# Patient Record
Sex: Female | Born: 1950 | Race: Asian | Hispanic: No | Marital: Married | Smoking: Never smoker
Health system: Southern US, Community
[De-identification: ages and names within clinical notes are randomized; demographics above are authoritative.]

## PROBLEM LIST (undated history)

## (undated) DIAGNOSIS — E785 Hyperlipidemia, unspecified: Secondary | ICD-10-CM

## (undated) DIAGNOSIS — K2270 Barrett's esophagus without dysplasia: Secondary | ICD-10-CM

## (undated) DIAGNOSIS — K219 Gastro-esophageal reflux disease without esophagitis: Secondary | ICD-10-CM

## (undated) HISTORY — PX: ABDOMINAL HYSTERECTOMY: SHX81

---

## 1999-02-23 DIAGNOSIS — C50919 Malignant neoplasm of unspecified site of unspecified female breast: Secondary | ICD-10-CM

## 1999-02-23 HISTORY — DX: Malignant neoplasm of unspecified site of unspecified female breast: C50.919

## 1999-02-23 HISTORY — PX: AUGMENTATION MAMMAPLASTY: SUR837

## 1999-02-23 HISTORY — PX: MASTECTOMY: SHX3

## 2018-10-23 ENCOUNTER — Other Ambulatory Visit: Payer: Self-pay | Admitting: Family Medicine

## 2018-10-23 DIAGNOSIS — Z1231 Encounter for screening mammogram for malignant neoplasm of breast: Secondary | ICD-10-CM

## 2018-11-08 ENCOUNTER — Other Ambulatory Visit: Payer: Self-pay | Admitting: Family Medicine

## 2018-11-08 DIAGNOSIS — M810 Age-related osteoporosis without current pathological fracture: Secondary | ICD-10-CM

## 2019-02-19 ENCOUNTER — Other Ambulatory Visit: Payer: Self-pay

## 2019-02-19 ENCOUNTER — Ambulatory Visit: Payer: Self-pay

## 2019-04-04 ENCOUNTER — Other Ambulatory Visit: Payer: Self-pay

## 2019-04-04 ENCOUNTER — Inpatient Hospital Stay: Admission: RE | Admit: 2019-04-04 | Payer: Self-pay | Source: Ambulatory Visit

## 2019-06-04 ENCOUNTER — Ambulatory Visit: Payer: Self-pay

## 2019-06-04 ENCOUNTER — Inpatient Hospital Stay: Admission: RE | Admit: 2019-06-04 | Payer: Self-pay | Source: Ambulatory Visit

## 2019-06-05 ENCOUNTER — Ambulatory Visit
Admission: RE | Admit: 2019-06-05 | Discharge: 2019-06-05 | Disposition: A | Discharge status: Home / Self Care | Payer: Medicare Other | Source: Ambulatory Visit | Attending: Family Medicine | Admitting: Family Medicine

## 2019-06-05 ENCOUNTER — Other Ambulatory Visit: Payer: Self-pay | Admitting: Family Medicine

## 2019-06-05 ENCOUNTER — Other Ambulatory Visit: Payer: Self-pay

## 2019-06-05 ENCOUNTER — Encounter (INDEPENDENT_AMBULATORY_CARE_PROVIDER_SITE_OTHER): Payer: Self-pay

## 2019-06-05 DIAGNOSIS — Z1231 Encounter for screening mammogram for malignant neoplasm of breast: Secondary | ICD-10-CM

## 2019-06-05 DIAGNOSIS — M810 Age-related osteoporosis without current pathological fracture: Secondary | ICD-10-CM | POA: Diagnosis not present

## 2020-08-25 IMAGING — MG DIGITAL SCREENING UNILAT RIGHT IMPLANT  W/ TOMO W/ CAD
8 series · 8 of 16 positions shown · non-contrast
Comparison: Previous exam(s).

CLINICAL DATA: Screening.

EXAM:
DIGITAL SCREENING UNILATERAL RIGHT MAMMOGRAM WITH IMPLANTS, CAD AND
TOMO
The patient has a retropectoral implant. Standard and implant
displaced views were performed.

[R CC (1 of 2)]
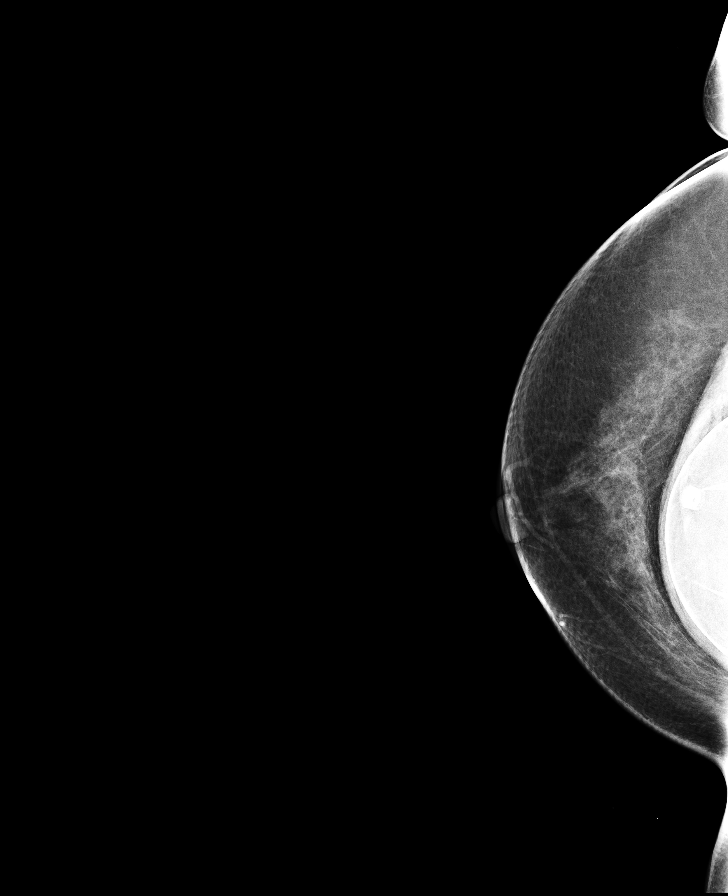

[R MLO (1 of 2)]
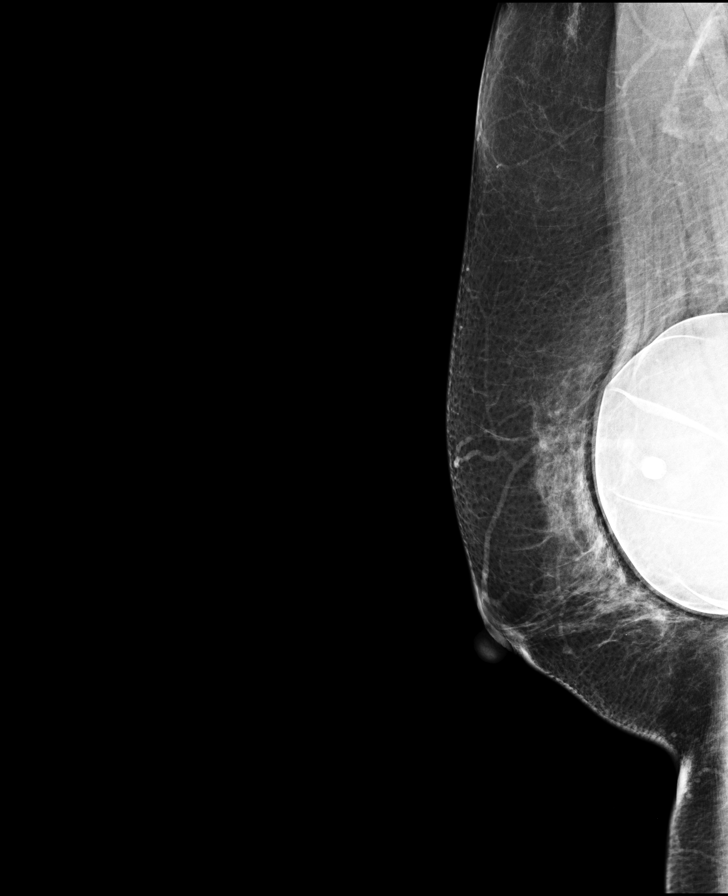

[R CC synth-2D]
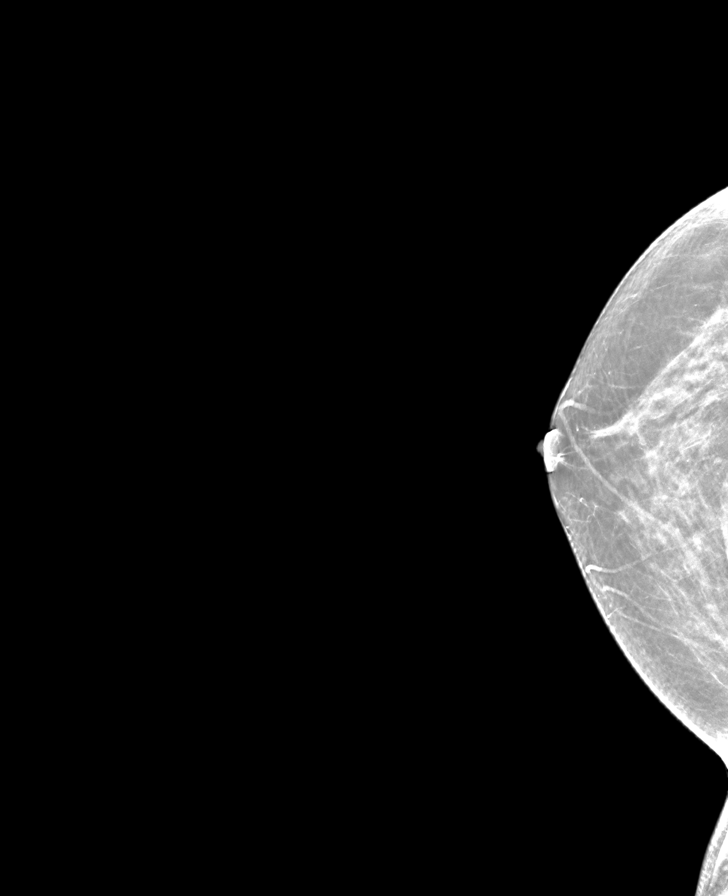

[R MLO (2 of 2)]
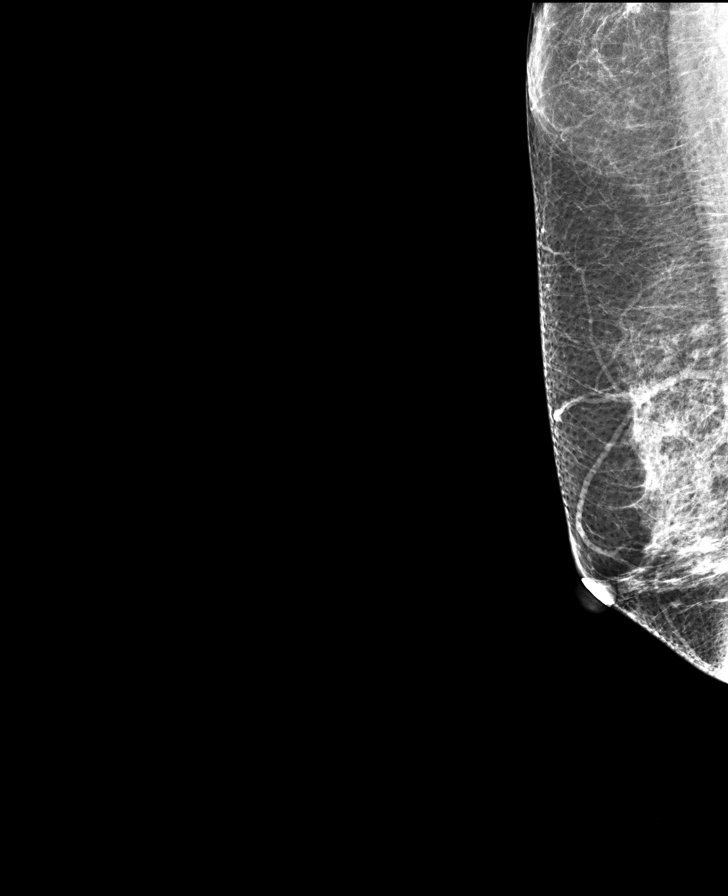

[R CC (2 of 2)]
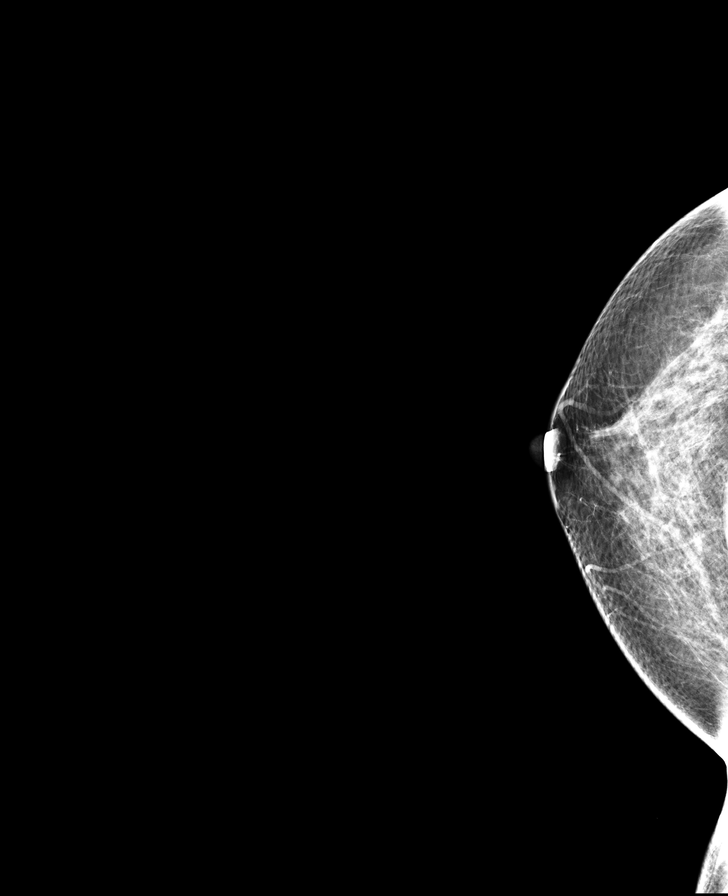

[R MLO synth-2D]
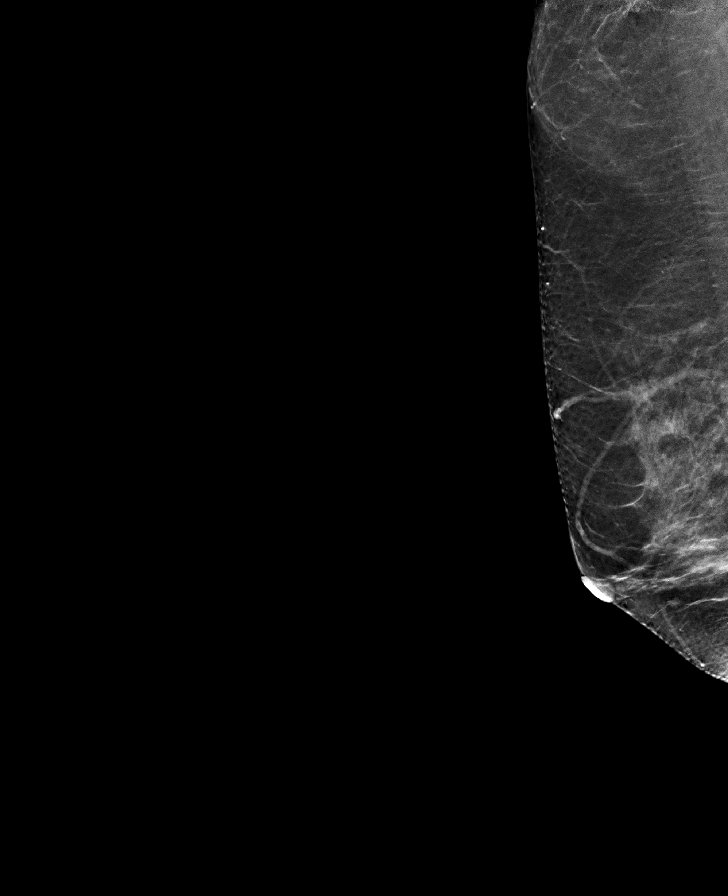

[R MLOID BREAST TOMOSYNTHESIS IMAGE tomo · tomo slice 33/64.0]
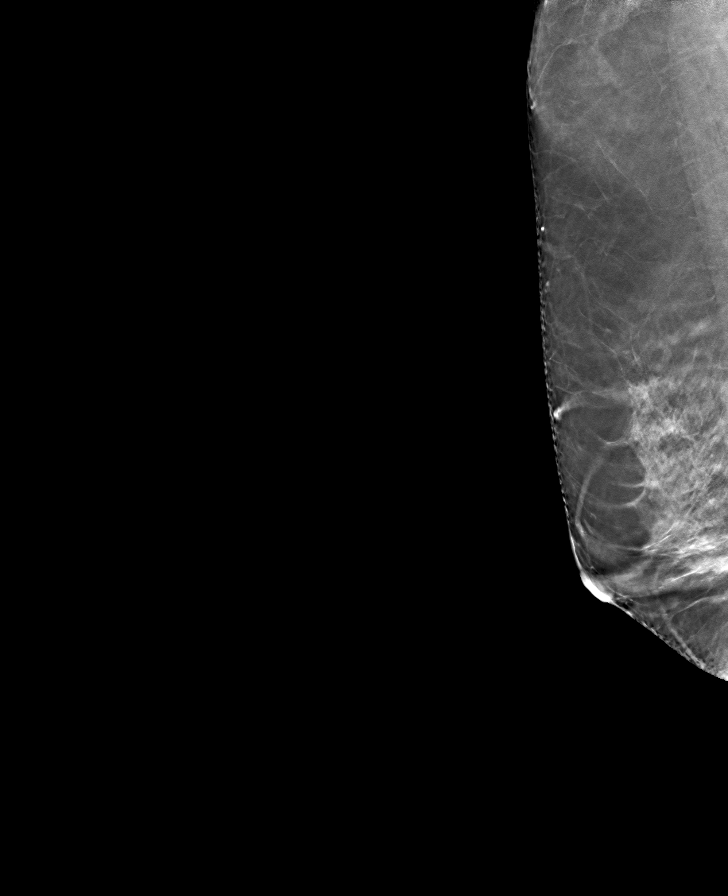

[R CCID BREAST TOMOSYNTHESIS IMAGE tomo · tomo slice 31/60.0]
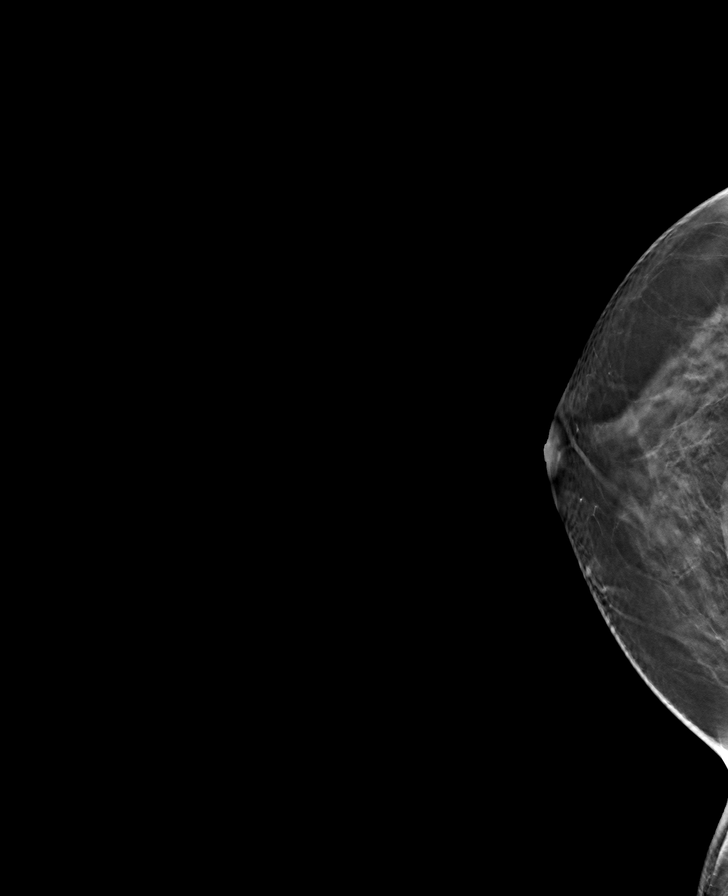

[8 of 16 positions shown; findings below may reference images not displayed]

ACR Breast Density Category b: There are scattered areas of
fibroglandular density.
FINDINGS: The patient has had a left mastectomy. There are no findings
suspicious for malignancy.

Images were processed with CAD.
IMPRESSION: No mammographic evidence of malignancy. A result letter of this
screening mammogram will be mailed directly to the patient.

RECOMMENDATION:
Screening mammogram in one year.  (Code:6P-P-C2P)

BI-RADS CATEGORY  1.  Negative

## 2021-11-17 ENCOUNTER — Encounter: Payer: Self-pay | Admitting: Emergency Medicine

## 2021-11-17 ENCOUNTER — Ambulatory Visit
Admission: EM | Admit: 2021-11-17 | Discharge: 2021-11-17 | Disposition: A | Discharge status: Home / Self Care | Payer: Medicare Other

## 2021-11-17 DIAGNOSIS — H1032 Unspecified acute conjunctivitis, left eye: Secondary | ICD-10-CM | POA: Diagnosis not present

## 2021-11-17 MED ORDER — MOXIFLOXACIN HCL 0.5 % OP SOLN: 1.0000 [drp] | Freq: Three times a day (TID) | OPHTHALMIC | 0 refills | Status: AC

## 2021-11-17 NOTE — ED Provider Notes (Signed)
[facility]    CSN: [id number] Arrival date & time: 11/17/21  1427      History   Chief Complaint Chief Complaint  Patient presents with   Eye Irritation     HPI Diana Whitney is a 71 y.o. female presenting for left eye redness, discomfort/irritation and thick yellowish-green drainage.  Patient says she woke up with the symptoms today.  She says that she had some sort of insect fly into her eye yesterday and had to flush it out.  She says she thinks it look like mosquito or not.  Denies vision change, headaches, dizziness.  Has not treated condition yet.  Does not wear contacts or glasses.  HPI  Past Medical History:  Diagnosis Date   Breast cancer ([place]) 2001   lt mastectomy    There are no problems to display for this patient.   Past Surgical History:  Procedure Laterality Date   AUGMENTATION MAMMAPLASTY Bilateral 2001   MASTECTOMY Left 2001    OB History   No obstetric history on file.      Home Medications    Prior to Admission medications   Medication Sig Start Date End Date Taking? Authorizing Provider  moxifloxacin (VIGAMOX) 0.5 % ophthalmic solution Place 1 drop into the left eye 3 (three) times daily for 7 days. 11/17/21 11/24/21 Yes Laurene Footman B, PA-C  pravastatin (PRAVACHOL) 80 MG tablet Take 1 tablet by mouth at bedtime. 09/10/21 09/10/22 Yes [provider]  pantoprazole (PROTONIX) 20 MG tablet Take 20 mg by mouth daily. 09/01/21   [provider]    Family History Family History  Problem Relation Age of Onset   Breast cancer Sister 67    Social History Social History   Tobacco Use   Smoking status: Never   Smokeless tobacco: Never  Vaping Use   Vaping Use: Never used  Substance Use Topics   Alcohol use: Not Currently   Drug use: Never     Allergies   Patient has no known allergies.   Review of Systems Review of Systems  HENT:  Negative for facial swelling.   Eyes:  Positive for discharge and redness.  Negative for photophobia, pain, itching and visual disturbance.  Skin:  Negative for wound.  Neurological:  Negative for dizziness and headaches.     Physical Exam Triage Vital Signs ED Triage Vitals [11/17/21 1501]  Enc Vitals Group     BP      Pulse      Resp      Temp      Temp src      SpO2      Weight      Height      Head Circumference      Peak Flow      Pain Score 4     Pain Loc      Pain Edu?      Excl. in [place]?    No data found.  Updated Vital Signs BP (!) 153/78 (BP Location: Left Arm)   Pulse 77   Temp 98.7 F (37.1 C) (Oral)   Resp 16   SpO2 100%   Visual Acuity Right Eye Distance:   Left Eye Distance:   Bilateral Distance:    Physical Exam Vitals and nursing note reviewed.  Constitutional:      General: She is not in acute distress.    Appearance: Normal appearance. She is not ill-appearing or toxic-appearing.  HENT:     Head: Normocephalic  and atraumatic.     Nose: Nose normal.     Mouth/Throat:     Mouth: Mucous membranes are moist.     Pharynx: Oropharynx is clear.  Eyes:     General: Lids are normal. Lids are everted, no foreign bodies appreciated. No scleral icterus.       Right eye: No discharge.        Left eye: Discharge (thick yellow/green drainage) present.    Conjunctiva/sclera:     Left eye: Left conjunctiva is injected.  Cardiovascular:     Rate and Rhythm: Normal rate and regular rhythm.  Pulmonary:     Effort: Pulmonary effort is normal. No respiratory distress.  Musculoskeletal:     Cervical back: Neck supple.  Skin:    General: Skin is dry.  Neurological:     General: No focal deficit present.     Mental Status: She is alert. Mental status is at baseline.     Motor: No weakness.     Gait: Gait normal.  Psychiatric:        Mood and Affect: Mood normal.        Behavior: Behavior normal.        Thought Content: Thought content normal.      UC Treatments / Results  Labs (all labs ordered are listed, but only  abnormal results are displayed) Labs Reviewed - No data to display  EKG   Radiology No results found.  Procedures Procedures (including critical care time)  Medications Ordered in UC Medications - No data to display  Initial Impression / Assessment and Plan / UC Course  I have reviewed the triage vital signs and the nursing notes.  Pertinent labs & imaging results that were available during my care of the patient were reviewed by me and considered in my medical decision making (see chart for details).   71 year old female presenting for left eye redness and pustular drainage since today.  Patient reports having an insect foreign body in her eye yesterday.  On examination she does have diffuse injection of the left conjunctiva and thick yellowish-green drainage.  No vision changes, extreme dizziness, fever.  Vision intact.  We will treat for suspected bacterial conjunctivitis with Vigamox.  Reviewed use of warm cloths to clear with the discharge.  Reviewed return and ER precautions.  Final Clinical Impressions(s) / UC Diagnoses   Final diagnoses:  Acute bacterial conjunctivitis of left eye     Discharge Instructions      -You have pinkeye.  I have sent antibiotic eyedrops to the pharmacy. -Wipe away the discharge with a warm washcloth.  Try not to touch the other eye or you can spread the infection. -Return if no improvement in symptoms after about 3 days or if symptoms worsen. - Go to ER if you have vision changes, fever, increased facial swelling      ED Prescriptions     Medication Sig Dispense Auth. Provider   moxifloxacin (VIGAMOX) 0.5 % ophthalmic solution Place 1 drop into the left eye 3 (three) times daily for 7 days. 3 mL Danton Clap, PA-C      PDMP not reviewed this encounter.   Danton Clap, PA-C 11/17/21 1529

## 2021-11-17 NOTE — ED Triage Notes (Signed)
Pt presents with left eye redness and drainage since yesterday. Pt states a insect flew into her eye.

## 2021-11-17 NOTE — Discharge Instructions (Signed)
-  You have pinkeye.  I have sent antibiotic eyedrops to the pharmacy. -Wipe away the discharge with a warm washcloth.  Try not to touch the other eye or you can spread the infection. -Return if no improvement in symptoms after about 3 days or if symptoms worsen. - Go to ER if you have vision changes, fever, increased facial swelling

## 2021-11-18 ENCOUNTER — Other Ambulatory Visit: Payer: Self-pay | Admitting: Family Medicine

## 2021-11-18 DIAGNOSIS — Z1231 Encounter for screening mammogram for malignant neoplasm of breast: Secondary | ICD-10-CM

## 2021-12-07 ENCOUNTER — Inpatient Hospital Stay: Admission: RE | Admit: 2021-12-07 | Payer: Medicare Other | Source: Ambulatory Visit

## 2021-12-23 ENCOUNTER — Ambulatory Visit
Admission: RE | Admit: 2021-12-23 | Discharge: 2021-12-23 | Disposition: A | Discharge status: Home / Self Care | Payer: Medicare Other | Source: Ambulatory Visit | Attending: Family Medicine | Admitting: Family Medicine

## 2021-12-23 DIAGNOSIS — Z1231 Encounter for screening mammogram for malignant neoplasm of breast: Secondary | ICD-10-CM | POA: Insufficient documentation

## 2022-07-20 ENCOUNTER — Encounter: Payer: Self-pay | Admitting: *Deleted

## 2022-07-23 ENCOUNTER — Encounter: Payer: Self-pay | Admitting: *Deleted

## 2022-07-26 ENCOUNTER — Ambulatory Visit: Payer: Medicare Other | Admitting: Anesthesiology

## 2022-07-26 ENCOUNTER — Encounter
Admission: RE | Disposition: A | Discharge status: Home / Self Care | Payer: Self-pay | Source: Home / Self Care | Attending: Gastroenterology

## 2022-07-26 ENCOUNTER — Ambulatory Visit
Admission: RE | Admit: 2022-07-26 | Discharge: 2022-07-26 | Disposition: A | Discharge status: Home / Self Care | Payer: Medicare Other | Attending: Gastroenterology | Admitting: Gastroenterology

## 2022-07-26 ENCOUNTER — Encounter: Payer: Self-pay | Admitting: *Deleted

## 2022-07-26 DIAGNOSIS — Z8719 Personal history of other diseases of the digestive system: Secondary | ICD-10-CM | POA: Insufficient documentation

## 2022-07-26 DIAGNOSIS — Z09 Encounter for follow-up examination after completed treatment for conditions other than malignant neoplasm: Secondary | ICD-10-CM | POA: Insufficient documentation

## 2022-07-26 DIAGNOSIS — Z9012 Acquired absence of left breast and nipple: Secondary | ICD-10-CM | POA: Diagnosis not present

## 2022-07-26 DIAGNOSIS — D122 Benign neoplasm of ascending colon: Secondary | ICD-10-CM | POA: Diagnosis not present

## 2022-07-26 DIAGNOSIS — Z08 Encounter for follow-up examination after completed treatment for malignant neoplasm: Secondary | ICD-10-CM | POA: Insufficient documentation

## 2022-07-26 DIAGNOSIS — Z86010 Personal history of colonic polyps: Secondary | ICD-10-CM | POA: Insufficient documentation

## 2022-07-26 DIAGNOSIS — K640 First degree hemorrhoids: Secondary | ICD-10-CM | POA: Insufficient documentation

## 2022-07-26 DIAGNOSIS — Z853 Personal history of malignant neoplasm of breast: Secondary | ICD-10-CM | POA: Insufficient documentation

## 2022-07-26 DIAGNOSIS — Z90710 Acquired absence of both cervix and uterus: Secondary | ICD-10-CM | POA: Insufficient documentation

## 2022-07-26 DIAGNOSIS — E785 Hyperlipidemia, unspecified: Secondary | ICD-10-CM | POA: Insufficient documentation

## 2022-07-26 DIAGNOSIS — K219 Gastro-esophageal reflux disease without esophagitis: Secondary | ICD-10-CM | POA: Insufficient documentation

## 2022-07-26 DIAGNOSIS — Z1211 Encounter for screening for malignant neoplasm of colon: Secondary | ICD-10-CM | POA: Insufficient documentation

## 2022-07-26 DIAGNOSIS — Z800 Family history of malignant neoplasm of digestive organs: Secondary | ICD-10-CM | POA: Diagnosis not present

## 2022-07-26 DIAGNOSIS — D124 Benign neoplasm of descending colon: Secondary | ICD-10-CM | POA: Diagnosis not present

## 2022-07-26 HISTORY — DX: Gastro-esophageal reflux disease without esophagitis: K21.9

## 2022-07-26 HISTORY — PX: ESOPHAGOGASTRODUODENOSCOPY (EGD) WITH PROPOFOL: SHX5813

## 2022-07-26 HISTORY — PX: COLONOSCOPY WITH PROPOFOL: SHX5780

## 2022-07-26 HISTORY — DX: Hyperlipidemia, unspecified: E78.5

## 2022-07-26 HISTORY — DX: Barrett's esophagus without dysplasia: K22.70

## 2022-07-26 SURGERY — COLONOSCOPY WITH PROPOFOL
Anesthesia: General

## 2022-07-26 MED ORDER — PROPOFOL 500 MG/50ML IV EMUL
INTRAVENOUS | Status: DC | PRN
  Administered 2022-07-26: 120 ug/kg/min via INTRAVENOUS

## 2022-07-26 MED ORDER — LIDOCAINE 2% (20 MG/ML) 5 ML SYRINGE
INTRAMUSCULAR | Status: DC | PRN
  Administered 2022-07-26: 20 mg via INTRAVENOUS

## 2022-07-26 MED ORDER — GLYCOPYRROLATE 0.2 MG/ML IJ SOLN
INTRAMUSCULAR | Status: DC | PRN
  Administered 2022-07-26: .2 mg via INTRAVENOUS

## 2022-07-26 MED ORDER — SODIUM CHLORIDE 0.9 % IV SOLN: INTRAVENOUS | Status: DC

## 2022-07-26 MED ORDER — PROPOFOL 10 MG/ML IV BOLUS
INTRAVENOUS | Status: DC | PRN
  Administered 2022-07-26: 70 mg via INTRAVENOUS

## 2022-07-26 NOTE — Interval H&P Note (Signed)
History and Physical Interval Note:  07/26/2022 8:58 AM  Diana Whitney  has presented today for surgery, with the diagnosis of GERD; Screening Colonoscopy.  The various methods of treatment have been discussed with the patient and family. After consideration of risks, benefits and other options for treatment, the patient has consented to  Procedure(s): COLONOSCOPY WITH PROPOFOL (N/A) ESOPHAGOGASTRODUODENOSCOPY (EGD) WITH PROPOFOL (N/A) as a surgical intervention.  The patient's history has been reviewed, patient examined, no change in status, stable for surgery.  I have reviewed the patient's chart and labs.  Questions were answered to the patient's satisfaction.     Regis Bill  Ok to proceed with EGD/Colonoscopy

## 2022-07-26 NOTE — Anesthesia Preprocedure Evaluation (Addendum)
Anesthesia Evaluation  Patient identified by MRN, date of birth, ID band Patient awake    Reviewed: Allergy & Precautions, H&P , NPO status , Patient's Chart, lab work & pertinent test results  Airway Mallampati: III  TM Distance: >3 FB Neck ROM: full    Dental no notable dental hx.    Pulmonary neg pulmonary ROS   Pulmonary exam normal        Cardiovascular negative cardio ROS Normal cardiovascular exam     Neuro/Psych negative neurological ROS  negative psych ROS   GI/Hepatic Neg liver ROS,GERD  Poorly Controlled,,  Endo/Other  negative endocrine ROS    Renal/GU negative Renal ROS  negative genitourinary   Musculoskeletal   Abdominal Normal abdominal exam  (+)   Peds  Hematology negative hematology ROS (+)   Anesthesia Other Findings Past Medical History: No date: Barrett's esophagus 2001: Breast cancer ([facility])     Comment:  lt mastectomy No date: HLD (hyperlipidemia)  Past Surgical History: No date: ABDOMINAL HYSTERECTOMY 2001: AUGMENTATION MAMMAPLASTY; Bilateral 2001: MASTECTOMY; Left     Reproductive/Obstetrics negative OB ROS                             Anesthesia Physical Anesthesia Plan  ASA: 2  Anesthesia Plan: General   Post-op Pain Management:    Induction: Intravenous  PONV Risk Score and Plan: Propofol infusion and TIVA  Airway Management Planned: Natural Airway  Additional Equipment:   Intra-op Plan:   Post-operative Plan:   Informed Consent: I have reviewed the patients History and Physical, chart, labs and discussed the procedure including the risks, benefits and alternatives for the proposed anesthesia with the patient or authorized representative who has indicated his/her understanding and acceptance.     Dental Advisory Given  Plan Discussed with: CRNA and Surgeon  Anesthesia Plan Comments:         Anesthesia Quick Evaluation

## 2022-07-26 NOTE — Op Note (Signed)
[facility] Gastroenterology Patient Name: Diana Whitney Procedure Date: 07/26/2022 9:06 AM MRN: [id number] Account #: [id number] Date of Birth: 1951-02-14 Admit Type: Outpatient Age: 72 Room: [facility] ENDO ROOM 3 Gender: Female Note Status: Finalized Instrument Name: Upper Endoscope [id number] Procedure:             Upper GI endoscopy Indications:           Gastro-esophageal reflux disease Providers:             Eather Colas MD, MD Referring MD:          Dione Housekeeper (Referring MD) Medicines:             Monitored Anesthesia Care Complications:         No immediate complications. Procedure:             Pre-Anesthesia Assessment:                        - Prior to the procedure, a History and Physical was                         performed, and patient medications and allergies were                         reviewed. The patient is competent. The risks and                         benefits of the procedure and the sedation options and                         risks were discussed with the patient. All questions                         were answered and informed consent was obtained.                         Patient identification and proposed procedure were                         verified by the physician, the nurse, the                         anesthesiologist, the anesthetist and the technician                         in the endoscopy suite. Mental Status Examination:                         alert and oriented. Airway Examination: normal                         oropharyngeal airway and neck mobility. Respiratory                         Examination: clear to auscultation. CV Examination:                         normal. Prophylactic Antibiotics: The patient does not  require prophylactic antibiotics. Prior                         Anticoagulants: The patient has taken no anticoagulant                         or antiplatelet agents. ASA Grade  Assessment: II - A                         patient with mild systemic disease. After reviewing                         the risks and benefits, the patient was deemed in                         satisfactory condition to undergo the procedure. The                         anesthesia plan was to use monitored anesthesia care                         (MAC). Immediately prior to administration of                         medications, the patient was re-assessed for adequacy                         to receive sedatives. The heart rate, respiratory                         rate, oxygen saturations, blood pressure, adequacy of                         pulmonary ventilation, and response to care were                         monitored throughout the procedure. The physical                         status of the patient was re-assessed after the                         procedure.                        After obtaining informed consent, the endoscope was                         passed under direct vision. Throughout the procedure,                         the patient's blood pressure, pulse, and oxygen                         saturations were monitored continuously. The Endoscope                         was introduced through the mouth, and advanced to the  second part of duodenum. The upper GI endoscopy was                         accomplished without difficulty. The patient tolerated                         the procedure well. Findings:      The esophagus and gastroesophageal junction were examined with white       light. There was no visual evidence of Barrett's esophagus.      The entire examined stomach was normal.      The examined duodenum was normal. Impression:            - There is no endoscopic evidence of Barrett's                         esophagus.                        - Normal stomach.                        - Normal examined duodenum.                        - No  specimens collected. Recommendation:        - Discharge patient to home.                        - Resume previous diet.                        - Continue present medications.                        - Return to referring physician as previously                         scheduled. Procedure Code(s):     --- Professional ---                        [phone], Esophagogastroduodenoscopy, flexible,                         transoral; diagnostic, including collection of                         specimen(s) by brushing or washing, when performed                         (separate procedure) Diagnosis Code(s):     --- Professional ---                        K21.9, Gastro-esophageal reflux disease without                         esophagitis CPT copyright 2022 American Medical Association. All rights reserved. The codes documented in this report are preliminary and upon coder review may  be revised to meet current compliance requirements. Eather Colas MD, MD 07/26/2022 9:38:51 AM Number of Addenda: 0 Note Initiated On: 07/26/2022 9:06 AM Estimated Blood Loss:  Estimated  blood loss: none.      [facility]

## 2022-07-26 NOTE — Anesthesia Postprocedure Evaluation (Signed)
Anesthesia Post Note  Patient: Diana Whitney  Procedure(s) Performed: COLONOSCOPY WITH PROPOFOL ESOPHAGOGASTRODUODENOSCOPY (EGD) WITH PROPOFOL  Patient location during evaluation: Endoscopy Anesthesia Type: General Level of consciousness: awake and alert Pain management: pain level controlled Vital Signs Assessment: post-procedure vital signs reviewed and stable Respiratory status: spontaneous breathing, nonlabored ventilation and respiratory function stable Cardiovascular status: blood pressure returned to baseline and stable Postop Assessment: no apparent nausea or vomiting Anesthetic complications: no   No notable events documented.   Last Vitals:  Vitals:   07/26/22 0934 07/26/22 0944  BP: (!) 96/59 120/67  Pulse: 73 72  Resp: 14 13  Temp: (!) 35.6 C   SpO2: 97% 98%    Last Pain:  Vitals:   07/26/22 0944  TempSrc:   PainSc: 0-No pain                 Foye Deer

## 2022-07-26 NOTE — Op Note (Signed)
[facility] Gastroenterology Patient Name: Diana Whitney Procedure Date: 07/26/2022 9:06 AM MRN: [id number] Account #: [id number] Date of Birth: [birth year] Admit Type: Outpatient Age: 72 Room: [facility] ENDO ROOM 3 Gender: Female Note Status: Finalized Instrument Name: Prentice Docker [id number] Procedure:             Colonoscopy Indications:           Surveillance: Personal history of colonic polyps                         (unknown histology) on last colonoscopy more than 5                         years ago Providers:             Eather Colas MD, MD Referring MD:          Dione Housekeeper (Referring MD) Medicines:             Monitored Anesthesia Care Complications:         No immediate complications. Estimated blood loss:                         Minimal. Procedure:             Pre-Anesthesia Assessment:                        - Prior to the procedure, a History and Physical was                         performed, and patient medications and allergies were                         reviewed. The patient is competent. The risks and                         benefits of the procedure and the sedation options and                         risks were discussed with the patient. All questions                         were answered and informed consent was obtained.                         Patient identification and proposed procedure were                         verified by the physician, the nurse, the                         anesthesiologist, the anesthetist and the technician                         in the endoscopy suite. Mental Status Examination:                         alert and oriented. Airway Examination: normal  oropharyngeal airway and neck mobility. Respiratory                         Examination: clear to auscultation. CV Examination:                         normal. Prophylactic Antibiotics: The patient does not                         require  prophylactic antibiotics. Prior                         Anticoagulants: The patient has taken no anticoagulant                         or antiplatelet agents. ASA Grade Assessment: II - A                         patient with mild systemic disease. After reviewing                         the risks and benefits, the patient was deemed in                         satisfactory condition to undergo the procedure. The                         anesthesia plan was to use monitored anesthesia care                         (MAC). Immediately prior to administration of                         medications, the patient was re-assessed for adequacy                         to receive sedatives. The heart rate, respiratory                         rate, oxygen saturations, blood pressure, adequacy of                         pulmonary ventilation, and response to care were                         monitored throughout the procedure. The physical                         status of the patient was re-assessed after the                         procedure.                        After obtaining informed consent, the colonoscope was                         passed under direct vision. Throughout the procedure,  the patient's blood pressure, pulse, and oxygen                         saturations were monitored continuously. The                         Colonoscope was introduced through the anus and                         advanced to the the cecum, identified by appendiceal                         orifice and ileocecal valve. The colonoscopy was                         performed without difficulty. The patient tolerated                         the procedure well. The quality of the bowel                         preparation was good. The ileocecal valve, appendiceal                         orifice, and rectum were photographed. Findings:      The perianal and digital rectal examinations were  normal.      A 4 mm polyp was found in the ascending colon. The polyp was sessile.       The polyp was removed with a cold snare. Resection and retrieval were       complete. Estimated blood loss was minimal.      A 3 mm polyp was found in the descending colon. The polyp was sessile.       The polyp was removed with a cold snare. Resection and retrieval were       complete. Estimated blood loss was minimal.      Internal hemorrhoids were found during retroflexion. The hemorrhoids       were Grade I (internal hemorrhoids that do not prolapse).      The exam was otherwise without abnormality on direct and retroflexion       views. Impression:            - One 4 mm polyp in the ascending colon, removed with                         a cold snare. Resected and retrieved.                        - One 3 mm polyp in the descending colon, removed with                         a cold snare. Resected and retrieved.                        - Internal hemorrhoids.                        - The examination was otherwise normal on direct and  retroflexion views. Recommendation:        - Discharge patient to home.                        - Resume previous diet.                        - Continue present medications.                        - Await pathology results.                        - Repeat colonoscopy in 7 years for surveillance.                        - Return to referring physician as previously                         scheduled. Procedure Code(s):     --- Professional ---                        [phone], Colonoscopy, flexible; with removal of                         tumor(s), polyp(s), or other lesion(s) by snare                         technique Diagnosis Code(s):     --- Professional ---                        Z86.010, Personal history of colonic polyps                        D12.2, Benign neoplasm of ascending colon                        D12.4, Benign neoplasm of  descending colon                        K64.0, First degree hemorrhoids CPT copyright 2022 American Medical Association. All rights reserved. The codes documented in this report are preliminary and upon coder review may  be revised to meet current compliance requirements. Eather Colas MD, MD 07/26/2022 9:43:56 AM Number of Addenda: 0 Note Initiated On: 07/26/2022 9:06 AM Scope Withdrawal Time: 0 hours 9 minutes 47 seconds  Total Procedure Duration: 0 hours 15 minutes 3 seconds  Estimated Blood Loss:  Estimated blood loss was minimal.      [facility]

## 2022-07-26 NOTE — H&P (Signed)
Outpatient short stay form Pre-procedure 07/26/2022  Regis Bill, MD  Primary Physician: Dione Housekeeper, MD  Reason for visit:  Possible history of BE and history of polyps  History of present illness:    72 y/o lady with history of GERD and HLD here for EGD/Colonoscopy for history of barrett's esophagus and history of colon polyps of unknown size/type. No blood thinners. Brother had colon cancer in his 12's. History of hysterectomy.    Current Facility-Administered Medications:    0.9 %  sodium chloride infusion, , Intravenous, Continuous, Clarivel Callaway, Rossie Muskrat, MD  Medications Prior to Admission  Medication Sig Dispense Refill Last Dose   albuterol (VENTOLIN HFA) 108 (90 Base) MCG/ACT inhaler Inhale 2 puffs into the lungs every 4 (four) hours as needed for wheezing or shortness of breath.      Multiple Vitamin (MULTIVITAMIN) tablet Take 1 tablet by mouth daily.      Multiple Vitamins-Minerals (PRESERVISION AREDS 2) CAPS Take 1 capsule by mouth 2 (two) times daily.      Vitamin D, Ergocalciferol, (DRISDOL) 1.25 MG (50000 UNIT) CAPS capsule Take 50,000 Units by mouth every 7 (seven) days.      pantoprazole (PROTONIX) 20 MG tablet Take 20 mg by mouth daily.      pravastatin (PRAVACHOL) 80 MG tablet Take 1 tablet by mouth at bedtime.        No Known Allergies   Past Medical History:  Diagnosis Date   Barrett's esophagus    Breast cancer (HCC) 2001   lt mastectomy   GERD (gastroesophageal reflux disease)    HLD (hyperlipidemia)     Review of systems:  Otherwise negative.    Physical Exam  Gen: Alert, oriented. Appears stated age.  HEENT: PERRLA. Lungs: No respiratory distress CV: RRR Abd: soft, benign, no masses Ext: No edema    Planned procedures: Proceed with EGD/colonoscopy. The patient understands the nature of the planned procedure, indications, risks, alternatives and potential complications including but not limited to bleeding, infection,  perforation, damage to internal organs and possible oversedation/side effects from anesthesia. The patient agrees and gives consent to proceed.  Please refer to procedure notes for findings, recommendations and patient disposition/instructions.     Regis Bill, MD [facility]

## 2022-07-26 NOTE — Transfer of Care (Signed)
Immediate Anesthesia Transfer of Care Note  Patient: Diana Whitney  Procedure(s) Performed: COLONOSCOPY WITH PROPOFOL ESOPHAGOGASTRODUODENOSCOPY (EGD) WITH PROPOFOL  Patient Location: Endoscopy Unit  Anesthesia Type:General  Level of Consciousness: drowsy  Airway & Oxygen Therapy: Patient Spontanous Breathing  Post-op Assessment: Report given to RN and Post -op Vital signs reviewed and stable  Post vital signs: Reviewed  Last Vitals:  Vitals Value Taken Time  BP 96/59 07/26/22 0934  Temp 35.6 C 07/26/22 0934  Pulse 73 07/26/22 0934  Resp 14 07/26/22 0934  SpO2 97 % 07/26/22 0934    Last Pain:  Vitals:   07/26/22 0934  TempSrc: Temporal  PainSc: Asleep         Complications: No notable events documented.

## 2022-07-27 ENCOUNTER — Encounter: Payer: Self-pay | Admitting: Gastroenterology

## 2023-06-21 ENCOUNTER — Encounter: Payer: Self-pay | Admitting: Emergency Medicine

## 2023-06-21 ENCOUNTER — Ambulatory Visit (INDEPENDENT_AMBULATORY_CARE_PROVIDER_SITE_OTHER)

## 2023-06-21 ENCOUNTER — Ambulatory Visit
Admission: EM | Admit: 2023-06-21 | Discharge: 2023-06-21 | Disposition: A | Discharge status: Home / Self Care | Attending: Emergency Medicine | Admitting: Emergency Medicine

## 2023-06-21 DIAGNOSIS — S68121A Partial traumatic metacarpophalangeal amputation of left index finger, initial encounter: Secondary | ICD-10-CM

## 2023-06-21 DIAGNOSIS — Z23 Encounter for immunization: Secondary | ICD-10-CM | POA: Diagnosis not present

## 2023-06-21 DIAGNOSIS — S68119A Complete traumatic metacarpophalangeal amputation of unspecified finger, initial encounter: Secondary | ICD-10-CM

## 2023-06-21 MED ORDER — TETANUS-DIPHTH-ACELL PERTUSSIS 5-2.5-18.5 LF-MCG/0.5 IM SUSY
0.5000 mL | PREFILLED_SYRINGE | Freq: Once | INTRAMUSCULAR | Status: AC
  Administered 2023-06-21: 0.5 mL via INTRAMUSCULAR

## 2023-06-21 MED ORDER — CEPHALEXIN 500 MG PO CAPS: 1000.0000 mg | ORAL_CAPSULE | Freq: Two times a day (BID) | ORAL | 0 refills | Status: AC

## 2023-06-21 MED ORDER — HYDROCODONE-ACETAMINOPHEN 5-325 MG PO TABS: 1.0000 | ORAL_TABLET | Freq: Four times a day (QID) | ORAL | 0 refills | Status: AC | PRN

## 2023-06-21 NOTE — Discharge Instructions (Signed)
 I have put in a stat referral to Dr. Agarwala for follow-up in 3 to 4 days.  You can also give them a call and make sure you get an appointment in that timeframe.  Take 400 mg of ibuprofen with a Tylenol containing product 3-4 times a day as needed for pain.  Either ibuprofen with 1000 mg of Tylenol for mild to moderate pain or ibuprofen with 1-2 Norco for severe pain.  Do not take Tylenol and Norco as they both had Tylenol in them and too much Tylenol can hurt your liver.  Do not exceed 4000 mg of Tylenol from all sources in 24 hours.  Finish Keflex unless Dr. Marce Sensing tells you to stop.  Go to the [facility] or [facility] emergency department for any signs of infection, for redness streaking up the hand, fevers above 100.4, or for other concerns.

## 2023-06-21 NOTE — ED Triage Notes (Signed)
 Pt cut her left pointer finger with a knife today. Last tetanus unknown.

## 2023-06-21 NOTE — ED Provider Notes (Signed)
 HPI  SUBJECTIVE:  Diana Whitney is a right-handed 73 y.o. 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  She reports pain and nonstop bleeding.  She applied pressure and came here.  No aggravating or alleviating factors. She has a past medical history of breast cancer, GERD, hyperlipidemia, hypertension.  She is not on any anticoagulants or antiplatelets.  Cannot remember last tetanus.  Past Medical History:  Diagnosis Date   Barrett's esophagus    Breast cancer (HCC) 2001   lt mastectomy   GERD (gastroesophageal reflux disease)    HLD (hyperlipidemia)     Past Surgical History:  Procedure Laterality Date   ABDOMINAL HYSTERECTOMY     AUGMENTATION MAMMAPLASTY Bilateral 2001   COLONOSCOPY WITH PROPOFOL  N/A 07/26/2022   Procedure: COLONOSCOPY WITH PROPOFOL ;  Surgeon: Shane Darling, MD;  Location: [facility] ENDOSCOPY;  Service: Endoscopy;  Laterality: N/A;   ESOPHAGOGASTRODUODENOSCOPY (EGD) WITH PROPOFOL  N/A 07/26/2022   Procedure: ESOPHAGOGASTRODUODENOSCOPY (EGD) WITH PROPOFOL ;  Surgeon: Shane Darling, MD;  Location: [facility] ENDOSCOPY;  Service: Endoscopy;  Laterality: N/A;   MASTECTOMY Left 2001    Family History  Problem Relation Age of Onset   Breast cancer Sister 16    Social History   Tobacco Use   Smoking status: Never   Smokeless tobacco: Never  Vaping Use   Vaping status: Never Used  Substance Use Topics   Alcohol use: Not Currently   Drug use: Never    No current facility-administered medications for this encounter.  Current Outpatient Medications:    cephALEXin (KEFLEX) 500 MG capsule, Take 2 capsules (1,000 mg total) by mouth 2 (two) times daily for 7 days., Disp: 28 capsule, Rfl: 0   HYDROcodone-acetaminophen (NORCO/VICODIN) 5-325 MG tablet, Take 1-2 tablets by mouth every 6 (six) hours as needed for moderate pain (pain score 4-6) or severe  pain (pain score 7-10)., Disp: 12 tablet, Rfl: 0   albuterol (VENTOLIN HFA) 108 (90 Base) MCG/ACT inhaler, Inhale 2 puffs into the lungs every 4 (four) hours as needed for wheezing or shortness of breath., Disp: , Rfl:    amLODipine (NORVASC) 5 MG tablet, Take 5 mg by mouth daily., Disp: , Rfl:    Multiple Vitamin (MULTIVITAMIN) tablet, Take 1 tablet by mouth daily., Disp: , Rfl:    Multiple Vitamins-Minerals (PRESERVISION AREDS 2) CAPS, Take 1 capsule by mouth 2 (two) times daily., Disp: , Rfl:    olmesartan (BENICAR) 20 MG tablet, Take 20 mg by mouth daily., Disp: , Rfl:    pantoprazole (PROTONIX) 20 MG tablet, Take 20 mg by mouth daily., Disp: , Rfl:    pravastatin (PRAVACHOL) 80 MG tablet, Take 1 tablet by mouth at bedtime., Disp: , Rfl:    Vitamin D, Ergocalciferol, (DRISDOL) 1.25 MG (50000 UNIT) CAPS capsule, Take 50,000 Units by mouth every 7 (seven) days., Disp: , Rfl:   No Known Allergies   ROS  As noted in HPI.   Physical Exam  BP 111/75 (BP Location: Right Arm)   Pulse 83   Temp 98.7 F (37.1 C) (Oral)   Resp 18   SpO2 97%   Constitutional: Well developed, well nourished, no acute distress Eyes:  EOMI, conjunctiva normal bilaterally HENT: Normocephalic, atraumatic,mucus membranes moist Respiratory: Normal inspiratory effort Cardiovascular: Normal rate GI: nondistended skin: No rash, skin intact Musculoskeletal: Avulsion laceration approximately 1.5 cm long with damage to the nail radial aspect  of the distal phalanx.  No obvious exposed bone.  No debris noted.  Neurologic: Alert & oriented x 3, no focal neuro deficits Psychiatric: Speech and behavior appropriate   ED Course   Medications  Tdap (BOOSTRIX) injection 0.5 mL (0.5 mLs Intramuscular Given 06/21/23 1520)    Orders Placed This Encounter  Procedures   DG Finger Index Left    Standing Status:   Standing    Number of Occurrences:   1    Reason for Exam (SYMPTOM  OR DIAGNOSIS REQUIRED):   chopped  radial aspect distal phalanx off, r/o exposed bone   AMB referral to orthopedics    Referral Priority:   Urgent    Referral Type:   Consultation    Referral Reason:   Specialty Services Required    Referred to Provider:   Merrill Abide, MD    Number of Visits Requested:   1   Apply dressing    Standing Status:   Standing    Number of Occurrences:   1    No results found for this or any previous visit (from the past 24 hours). No results found.  ED Clinical Impression  1. Traumatic amputation of tip of finger, initial encounter      ED Assessment/Plan     Creatinine clearance from labs done in January 2025 94 mL/min.  After obtaining verbal consent.  Cleaned finger.  Performed digital block with 1.5 cc of 1% plain lidocaine  with adequate anesthesia.  Will x-ray finger to rule out exposed bone/open fracture.  Once numb, cleaned with chlorhexidine soap and water, applied SurgiCell and wrapped in Xeroform.  placed pressure dressing.  Patient tolerated procedure well.   Narcotic database reviewed for this patient, and feel that the risk/benefit ratio today is favorable for proceeding with a prescription for controlled substance.  No opiate prescriptions in the past 2 years.  Reviewed imaging independently. No exposed bone per my read.  Will contact husband Diana Whitney at [phone] if radiology overread differs enough for mine and we need to change management  Reviewed radiology report.  No open fracture consistent with my read.  See radiology report for full details.  Sending home with Keflex for 7 days for prophylaxis, Tylenol/ibuprofen or Tylenol/Norco.  Updating tetanus.  Will have her follow-up with Dr. Marce Sensing, hand surgeon, at [facility]. Putting in stat referral for follow-up in 3 to 4 days.  Discussed labs, imaging, MDM, treatment plan, and plan for follow-up with patient and family member. Discussed sn/sx that should prompt return to the ED. they agree with  plan.   Meds ordered this encounter  Medications   Tdap (BOOSTRIX) injection 0.5 mL   HYDROcodone-acetaminophen (NORCO/VICODIN) 5-325 MG tablet    Sig: Take 1-2 tablets by mouth every 6 (six) hours as needed for moderate pain (pain score 4-6) or severe pain (pain score 7-10).    Dispense:  12 tablet    Refill:  0   cephALEXin (KEFLEX) 500 MG capsule    Sig: Take 2 capsules (1,000 mg total) by mouth 2 (two) times daily for 7 days.    Dispense:  28 capsule    Refill:  0      *This clinic note was created using Scientist, clinical (histocompatibility and immunogenetics). Therefore, there may be occasional mistakes despite careful proofreading.  ?    Ethlyn Herd, MD 06/21/23 [phone]
